# Patient Record
Sex: Female | Born: 1935 | Race: White | Hispanic: No | Marital: Married
Health system: Southern US, Community
[De-identification: ages and names within clinical notes are randomized; demographics above are authoritative.]

## PROBLEM LIST (undated history)

## (undated) DIAGNOSIS — I639 Cerebral infarction, unspecified: Secondary | ICD-10-CM

## (undated) DIAGNOSIS — F0390 Unspecified dementia without behavioral disturbance: Secondary | ICD-10-CM

## (undated) DIAGNOSIS — J449 Chronic obstructive pulmonary disease, unspecified: Secondary | ICD-10-CM

---

## 2004-06-09 ENCOUNTER — Ambulatory Visit: Payer: Self-pay | Admitting: Family Medicine

## 2004-09-22 ENCOUNTER — Ambulatory Visit: Payer: Self-pay | Admitting: Family Medicine

## 2004-10-14 ENCOUNTER — Ambulatory Visit: Payer: Self-pay | Admitting: Family Medicine

## 2004-11-21 ENCOUNTER — Ambulatory Visit: Payer: Self-pay | Admitting: Family Medicine

## 2005-01-23 ENCOUNTER — Ambulatory Visit: Payer: Self-pay | Admitting: Family Medicine

## 2005-07-06 ENCOUNTER — Ambulatory Visit: Payer: Self-pay | Admitting: Family Medicine

## 2013-03-11 ENCOUNTER — Other Ambulatory Visit (HOSPITAL_COMMUNITY): Payer: Self-pay | Admitting: Interventional Radiology

## 2013-03-11 DIAGNOSIS — I771 Stricture of artery: Secondary | ICD-10-CM

## 2013-03-11 DIAGNOSIS — I639 Cerebral infarction, unspecified: Secondary | ICD-10-CM

## 2013-03-12 ENCOUNTER — Ambulatory Visit (HOSPITAL_COMMUNITY)
Admission: RE | Admit: 2013-03-12 | Discharge: 2013-03-12 | Disposition: A | Discharge status: Home / Self Care | Payer: Medicare Other | Source: Ambulatory Visit | Attending: Interventional Radiology | Admitting: Interventional Radiology

## 2013-03-12 ENCOUNTER — Encounter (HOSPITAL_COMMUNITY): Payer: Self-pay

## 2013-03-12 ENCOUNTER — Other Ambulatory Visit (HOSPITAL_COMMUNITY): Payer: Self-pay | Admitting: Interventional Radiology

## 2013-03-12 DIAGNOSIS — I639 Cerebral infarction, unspecified: Secondary | ICD-10-CM

## 2013-03-12 DIAGNOSIS — Z7982 Long term (current) use of aspirin: Secondary | ICD-10-CM | POA: Insufficient documentation

## 2013-03-12 DIAGNOSIS — I771 Stricture of artery: Secondary | ICD-10-CM

## 2013-03-12 DIAGNOSIS — J4489 Other specified chronic obstructive pulmonary disease: Secondary | ICD-10-CM | POA: Insufficient documentation

## 2013-03-12 DIAGNOSIS — I672 Cerebral atherosclerosis: Secondary | ICD-10-CM | POA: Insufficient documentation

## 2013-03-12 DIAGNOSIS — I69959 Hemiplegia and hemiparesis following unspecified cerebrovascular disease affecting unspecified side: Secondary | ICD-10-CM | POA: Insufficient documentation

## 2013-03-12 DIAGNOSIS — I69992 Facial weakness following unspecified cerebrovascular disease: Secondary | ICD-10-CM | POA: Insufficient documentation

## 2013-03-12 DIAGNOSIS — I6529 Occlusion and stenosis of unspecified carotid artery: Secondary | ICD-10-CM | POA: Insufficient documentation

## 2013-03-12 DIAGNOSIS — I658 Occlusion and stenosis of other precerebral arteries: Secondary | ICD-10-CM | POA: Insufficient documentation

## 2013-03-12 DIAGNOSIS — F0390 Unspecified dementia without behavioral disturbance: Secondary | ICD-10-CM | POA: Insufficient documentation

## 2013-03-12 DIAGNOSIS — I708 Atherosclerosis of other arteries: Secondary | ICD-10-CM | POA: Insufficient documentation

## 2013-03-12 DIAGNOSIS — J449 Chronic obstructive pulmonary disease, unspecified: Secondary | ICD-10-CM | POA: Insufficient documentation

## 2013-03-12 DIAGNOSIS — Z7902 Long term (current) use of antithrombotics/antiplatelets: Secondary | ICD-10-CM | POA: Insufficient documentation

## 2013-03-12 HISTORY — DX: Cerebral infarction, unspecified: I63.9

## 2013-03-12 HISTORY — DX: Chronic obstructive pulmonary disease, unspecified: J44.9

## 2013-03-12 HISTORY — DX: Unspecified dementia, unspecified severity, without behavioral disturbance, psychotic disturbance, mood disturbance, and anxiety: F03.90

## 2013-03-12 MED ORDER — FENTANYL CITRATE 0.05 MG/ML IJ SOLN
INTRAMUSCULAR | Status: AC | PRN
  Administered 2013-03-12 (×2): 12.5 ug via INTRAVENOUS

## 2013-03-12 MED ORDER — MIDAZOLAM HCL 2 MG/2ML IJ SOLN
INTRAMUSCULAR | Status: AC
  Filled 2013-03-12: qty 4

## 2013-03-12 MED ORDER — HEPARIN SODIUM (PORCINE) 1000 UNIT/ML IJ SOLN
INTRAMUSCULAR | Status: AC | PRN
  Administered 2013-03-12: 500 [IU] via INTRAVENOUS

## 2013-03-12 MED ORDER — IOHEXOL 300 MG/ML  SOLN
150.0000 mL | Freq: Once | INTRAMUSCULAR | Status: AC | PRN
  Administered 2013-03-12: 65 mL via INTRA_ARTERIAL

## 2013-03-12 MED ORDER — MIDAZOLAM HCL 2 MG/2ML IJ SOLN
INTRAMUSCULAR | Status: AC | PRN
  Administered 2013-03-12 (×2): 0.5 mg via INTRAVENOUS

## 2013-03-12 MED ORDER — FENTANYL CITRATE 0.05 MG/ML IJ SOLN
INTRAMUSCULAR | Status: AC
  Filled 2013-03-12: qty 4

## 2013-03-12 NOTE — ED Notes (Signed)
Patient denies pain and is resting comfortably.  

## 2013-03-12 NOTE — H&P (Signed)
Latasha Hunter is an 79 y.o. female.   Chief Complaint: CVA 03/09/2013 Lt sided weakness; Lt facial droop Lt sided paralysis Pt was normal self at noon that day; grandson checked on her at 5pm and found in bed with stroke sxs To [facility] Work up reveals CVA; Rt carotid disease- thrombus/stensosis Scheduled now for cerebral arteriogram HPI: COPD; dementia; CVA  Past Medical History  Diagnosis Date  . COPD (chronic obstructive pulmonary disease)   . Dementia   . Stroke     History reviewed. No pertinent past surgical history.  History reviewed. No pertinent family history. Social History:  does not have a smoking history on file. She has never used smokeless tobacco. Her alcohol and drug histories are not on file.  Allergies: Allergies no known allergies   (Not in a hospital admission)  No results found for this or any previous visit (from the past 48 hour(s)). No results found.  Review of Systems  Constitutional: Positive for weight loss. Negative for fever.  HENT: Positive for hearing loss.   Respiratory: Negative for shortness of breath.   Cardiovascular: Negative for chest pain.  Gastrointestinal: Negative for nausea, vomiting and abdominal pain.  Neurological: Positive for focal weakness and weakness. Negative for dizziness.  Psychiatric/Behavioral: Positive for memory loss.    There were no vitals taken for this visit. Physical Exam  Constitutional:  thin  HENT:  Lt facial droop  Cardiovascular: Normal rate, regular rhythm and normal heart sounds.   No murmur heard. Respiratory: Effort normal. She has wheezes.  GI: Soft. Bowel sounds are normal. There is no tenderness.  Musculoskeletal: Normal range of motion.   Lt side very weak Not much movement  Neurological:  Dementia Does answer most questions appropriately  Skin: Skin is warm.  Psychiatric:  Consented with son     Assessment/Plan CVA 03/09/13 Lt side weak; paralysis Lt side facial  droop Work up shows CVA and R carotid dz Scheduled for Cer arteriogram Pt and family aware of procedure benefits and risks and agreeable to proceed Consent signed and in chart  Latasha Hunter A 03/12/2013, 11:23 AM

## 2013-03-12 NOTE — Procedures (Signed)
S/P bilateral carotid arteriograms and bilat vertebral artery angiograms. Rt CFA approach. Findings. 1.Long segment smooth non obstructive filling defect RT ICA prox C3 to C2,probably representing  A dissection without the presence of a a free thrombus. 2. Severe stenosis of RT MCA bifurcation branches.

## 2013-09-28 DEATH — deceased

## 2013-11-11 ENCOUNTER — Telehealth (HOSPITAL_COMMUNITY): Payer: Self-pay | Admitting: Interventional Radiology

## 2013-11-11 ENCOUNTER — Other Ambulatory Visit (HOSPITAL_COMMUNITY): Payer: Self-pay | Admitting: Interventional Radiology

## 2013-11-11 DIAGNOSIS — I639 Cerebral infarction, unspecified: Secondary | ICD-10-CM

## 2013-11-11 NOTE — Telephone Encounter (Signed)
Tried to call pt, phone number no longer in service. JM

## 2015-01-04 IMAGING — XA IR ANGIO INTRA EXTRACRAN SEL COM CAROTID INNOMINATE BILAT MOD SE
1 series · 12 of 24 positions shown · IV contrast (IODINE)
Comparison: MRI of the head of 03/10/2013 and CT angiogram of the
neck of 03/11/2013.

CLINICAL DATA: Right middle cerebral artery distribution stroke.
Abnormal CT angiogram of the neck.

BILATERAL COMMON CAROTID ARTERY AND BILATERAL VERTEBRAL ARTERY
ANGIOGRAMS

[Series 300: neuro · 12 of 170 slices shown]
[im 8/170]
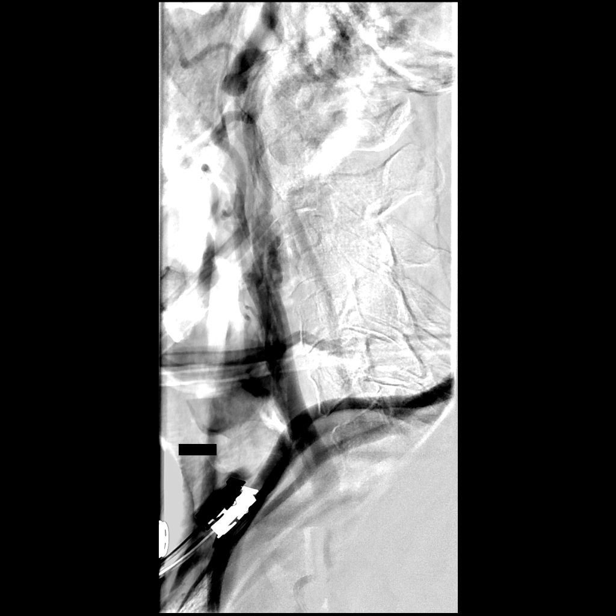
[im 23/170]
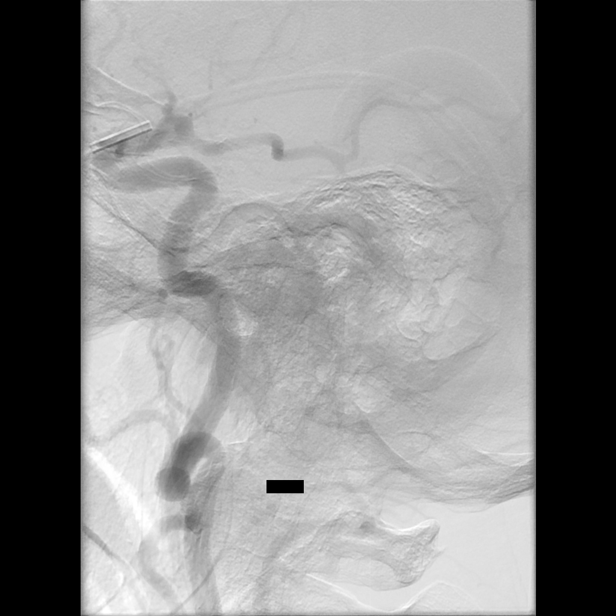
[im 37/170]
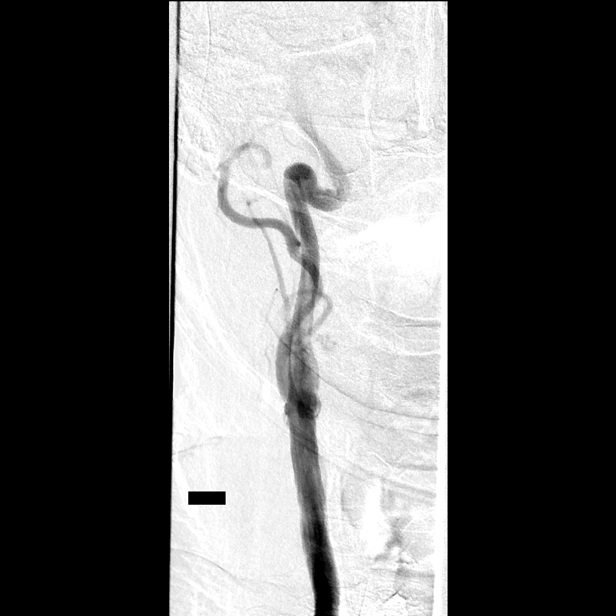
[im 52/170]
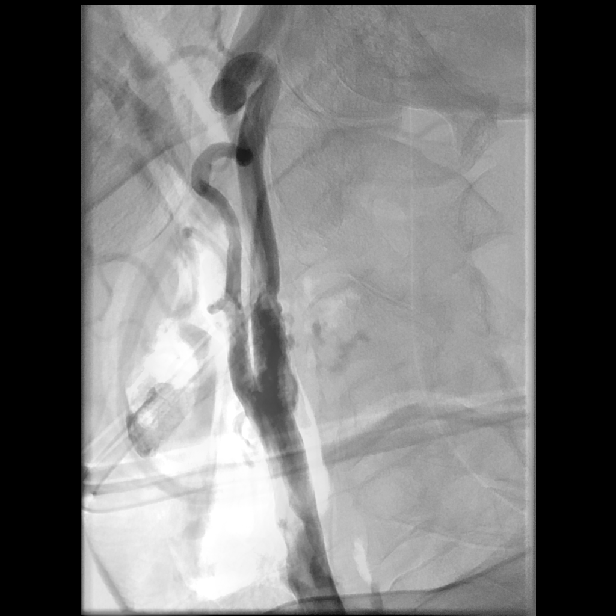
[im 67/170]
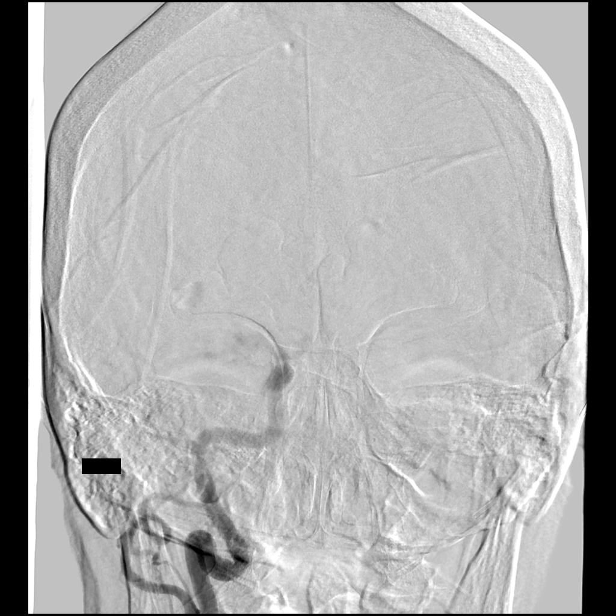
[im 81/170]
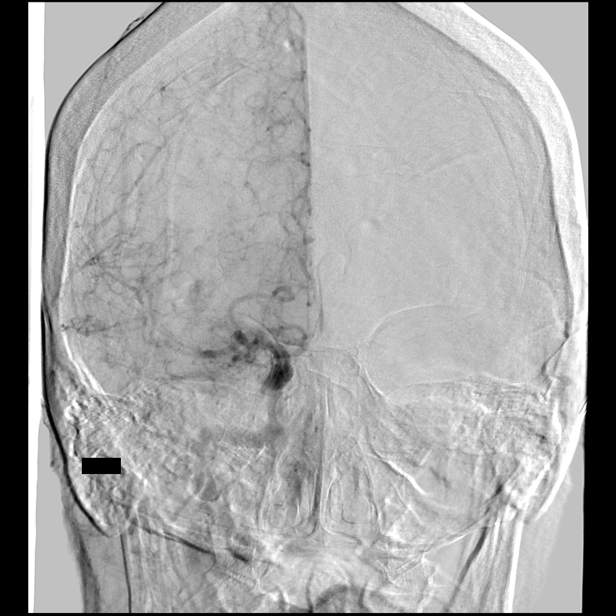
[im 96/170]
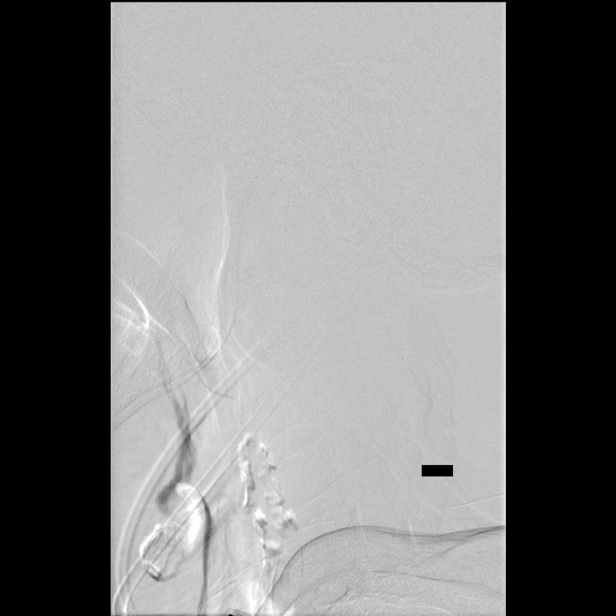
[im 111/170]
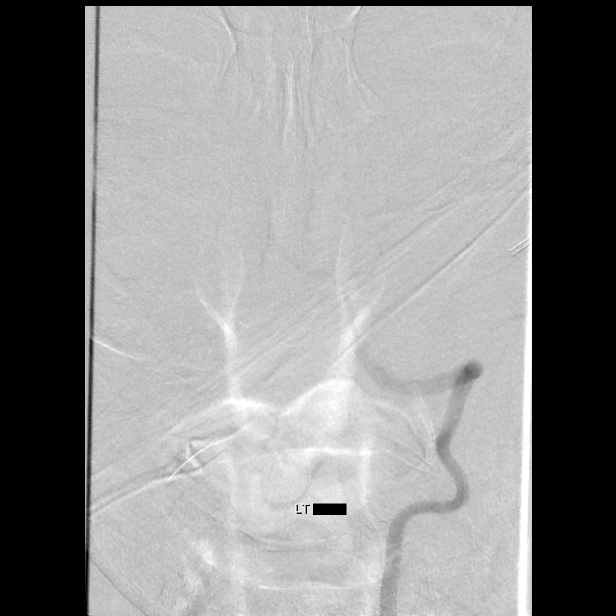
[im 125/170]
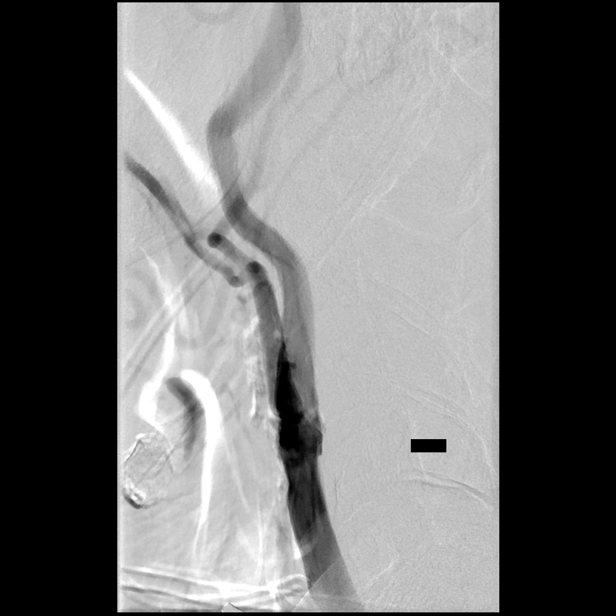
[im 140/170]
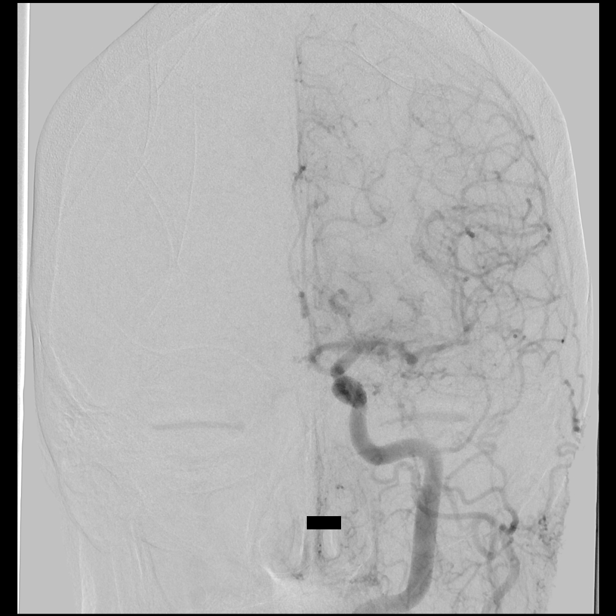
[im 155/170]
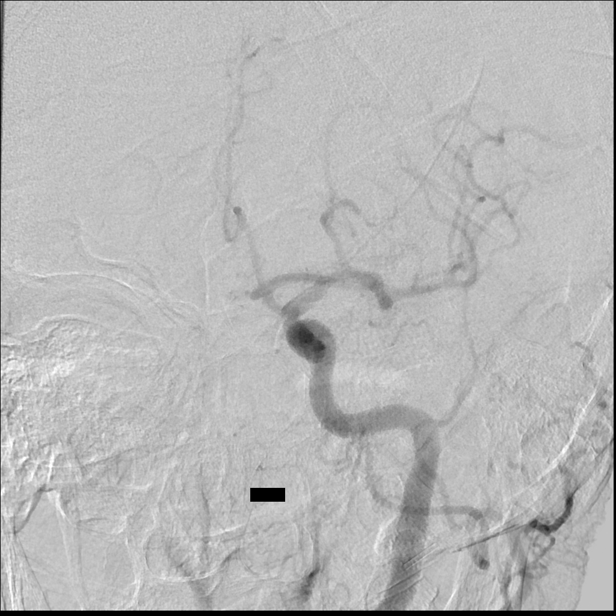
[im 170/170]
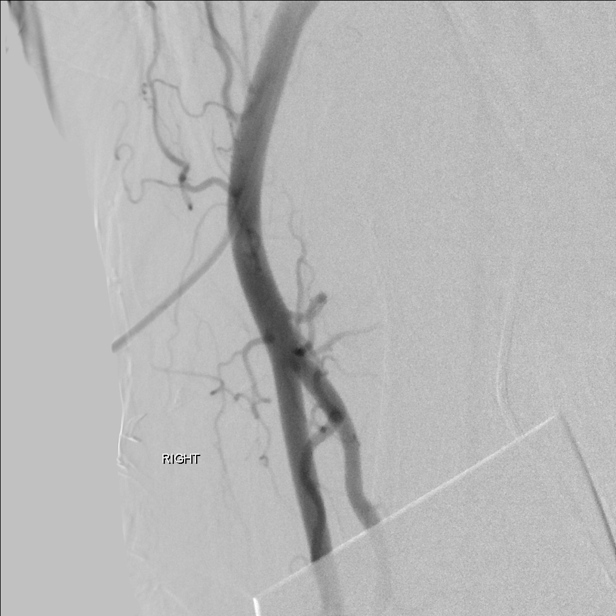

[12 of 24 positions shown; findings below may reference images not displayed]

Following a full explanation of the procedure along with the
potential associated complications, an informed witnessed consent
was obtained.

The right groin was prepped and draped in the usual sterile
fashion.  Thereafter using modified Seldinger technique,
transfemoral access into the right common femoral artery was
obtained without difficulty.  Over a 0.035" guidewire, a 5-French
Pinnacle sheath was inserted.  Through this and also over a 0.035"
guidewire, a 5-French JB1 catheter was advanced to the aortic arch
region and selectively positioned in the right common carotid
artery, the right subclavian artery, the left common artery and the
left vertebral artery.

There were no acute complications.  The patient tolerated the
procedure well.

Medications utilized: Versed 1 mg IV.  Fentanyl 25 mcg IV.

Contrast: Qmnipaque-TWW approximately 55 ml.
FINDINGS: The right common carotid arteriogram demonstrates the
right external carotid artery and its major branches to be normal.

The right internal carotid artery at the bulb demonstrates mild
atherosclerotic irregularity.

Distal to the bulb there is a smooth long segment filling defect
along the anterolateral wall which extends from C3 to C2.

This is associated with a focal area of narrowing of approximately
50% at the level of C2-C3.

More distally there is mild tortuosity of the right internal
carotid artery at the level of C1.

Distal to this the vessel opacifies normally to the petrous, the
cavernous and the supraclinoid segments.

A dominant right posterior communicating artery is seen opacifying
the right posterior cerebral artery and superior cerebellar artery
distributions.

The right anterior cerebral artery demonstrates focal areas of mild
caliber irregularity proximally consistent with intracranial
arteriosclerosis.

The right middle cerebral artery bifurcation branches demonstrate
focal areas of moderately severe narrowing with the distal
perisylvian branches and the parietal branches demonstrating small
focal areas of hair-thin filling defects probably representing
recanalizing vessels from previous thrombotic occlusions.

The delayed arterial phase demonstrates a moderate-sized area of
hyperperfusion involving the right posterior parietal subcortical
area.

The left subclavian arteriogram demonstrates mild atherosclerotic
disease involving the left subclavian artery proximally.

The left vertebral artery arises from the thyrocervical trunk
region.

The vessel opacifies to the cranial skull base into the left
posterior inferior cerebellar artery and the left vertebrobasilar
junction.

Distal flow into a hypoplastic basilar artery is seen to opacify
the posterior cerebral arteries, and the anterior inferior
cerebellar arteries.

Unopacified blood is seen in the basilar artery from the
contralateral vertebral artery.

The left right common carotid arteriogram demonstrates the left
external carotid artery and its major branches to be normal.

The left internal carotid artery at the bulb has a smooth shallow
plaque without associated stenosis.

The vessel ascends normally to the cranial skull base.

The petrous, the cavernous and the supraclinoid segments are
normal.

A dominant left posterior communicating artery is seen to opacify
the left posterior cerebral and superior cerebellar artery
distributions.

The left middle and the left anterior cerebral arteries opacify
into the capillary and venous phases with a few scattered focal
areas of caliber irregularity probably representing intracranial
arteriosclerosis.

IMPRESSION
1.  Long segment non-obstructive smooth filling defect involving
the right internal carotid artery along its anterolateral wall,
with approximately 50% stenosis. Angiographic findings are
suggestive more of a dissection with secondary stenoses, rather
than a loose free thrombus.
2.  More severe narrowing of the right middle cerebral artery
trifurcation branches, with focal hair-thin filling defects of the
right MCA branches in the M3 region.  These probably represent
areas of recanalization.

The angiograph findings were reviewed with the patient and the
patient's referring physician Dr. Aadam from [facility].

It was opted for the patient to continue on aspirin and Plavix with
hydration. A follow-up CT angiogram of the head in 6 weeks' time
will be scheduled.
